# Patient Record
Sex: Female | Born: 1970 | Race: Black or African American | Hispanic: No | Marital: Married | Smoking: Never smoker
Health system: Southern US, Community
[De-identification: ages and names within clinical notes are randomized; demographics above are authoritative.]

## PROBLEM LIST (undated history)

## (undated) HISTORY — PX: ABDOMINAL HYSTERECTOMY: SHX81

---

## 1998-08-23 ENCOUNTER — Inpatient Hospital Stay (HOSPITAL_COMMUNITY): Admission: AD | Admit: 1998-08-23 | Discharge: 1998-08-26 | Payer: Self-pay | Admitting: Obstetrics and Gynecology

## 1998-10-28 ENCOUNTER — Ambulatory Visit (HOSPITAL_COMMUNITY): Admission: RE | Admit: 1998-10-28 | Discharge: 1998-10-28 | Payer: Self-pay | Admitting: Obstetrics and Gynecology

## 2000-12-17 ENCOUNTER — Other Ambulatory Visit: Admission: RE | Admit: 2000-12-17 | Discharge: 2000-12-17 | Payer: Self-pay | Admitting: Obstetrics and Gynecology

## 2002-10-07 ENCOUNTER — Encounter: Payer: Self-pay | Admitting: Obstetrics and Gynecology

## 2002-10-07 ENCOUNTER — Ambulatory Visit (HOSPITAL_COMMUNITY): Admission: RE | Admit: 2002-10-07 | Discharge: 2002-10-07 | Payer: Self-pay | Admitting: Obstetrics and Gynecology

## 2002-10-09 ENCOUNTER — Encounter (INDEPENDENT_AMBULATORY_CARE_PROVIDER_SITE_OTHER): Payer: Self-pay | Admitting: *Deleted

## 2002-10-09 ENCOUNTER — Ambulatory Visit (HOSPITAL_COMMUNITY): Admission: RE | Admit: 2002-10-09 | Discharge: 2002-10-09 | Payer: Self-pay | Admitting: Obstetrics and Gynecology

## 2004-10-31 ENCOUNTER — Encounter: Admission: RE | Admit: 2004-10-31 | Discharge: 2004-10-31 | Payer: Self-pay | Admitting: Obstetrics and Gynecology

## 2004-10-31 ENCOUNTER — Other Ambulatory Visit: Admission: RE | Admit: 2004-10-31 | Discharge: 2004-10-31 | Payer: Self-pay | Admitting: Obstetrics and Gynecology

## 2006-09-25 ENCOUNTER — Ambulatory Visit (HOSPITAL_COMMUNITY): Admission: RE | Admit: 2006-09-25 | Discharge: 2006-09-26 | Payer: Self-pay | Admitting: Obstetrics and Gynecology

## 2007-01-28 ENCOUNTER — Encounter: Admission: RE | Admit: 2007-01-28 | Discharge: 2007-01-28 | Payer: Self-pay | Admitting: Obstetrics and Gynecology

## 2007-11-05 ENCOUNTER — Encounter: Admission: RE | Admit: 2007-11-05 | Discharge: 2007-11-05 | Payer: Self-pay | Admitting: Family Medicine

## 2008-06-13 ENCOUNTER — Emergency Department (HOSPITAL_COMMUNITY): Admission: EM | Admit: 2008-06-13 | Discharge: 2008-06-13 | Payer: Self-pay | Admitting: Emergency Medicine

## 2008-11-05 ENCOUNTER — Encounter: Admission: RE | Admit: 2008-11-05 | Discharge: 2008-11-05 | Payer: Self-pay | Admitting: Obstetrics and Gynecology

## 2008-11-15 ENCOUNTER — Encounter: Admission: RE | Admit: 2008-11-15 | Discharge: 2008-12-22 | Payer: Self-pay | Admitting: Occupational Medicine

## 2010-02-01 ENCOUNTER — Encounter: Admission: RE | Admit: 2010-02-01 | Discharge: 2010-05-02 | Payer: Self-pay | Admitting: Orthopedic Surgery

## 2010-05-04 ENCOUNTER — Encounter: Admission: RE | Admit: 2010-05-04 | Discharge: 2010-06-15 | Payer: Self-pay | Admitting: Orthopedic Surgery

## 2010-10-28 ENCOUNTER — Encounter: Payer: Self-pay | Admitting: Obstetrics and Gynecology

## 2011-02-23 NOTE — Op Note (Signed)
[name], [name]               ACCOUNT NO.:  [id number]   MEDICAL RECORD NO.:  [id number]          PATIENT TYPE:  AMB   LOCATION:  SDC                           FACILITY:  [facility]   PHYSICIAN:  Guy Sandifer. Henderson Cloud, M.D. DATE OF BIRTH:  1971/05/16   DATE OF PROCEDURE:  09/25/2006  DATE OF DISCHARGE:                               OPERATIVE REPORT   PREOPERATIVE DIAGNOSIS:  Menometrorrhagia.   POSTOPERATIVE DIAGNOSIS:  Menometrorrhagia.   PROCEDURE:  Laparoscopically-assisted vaginal hysterectomy.   SURGEON:  Harold Hedge, M.D.   ASSISTANT:  Zelphia Cairo, M.D.   ANESTHESIA:  General endotracheal intubation.   ESTIMATED BLOOD LOSS:  300 mL.   SPECIMENS:  Uterus to pathology.   INDICATIONS AND CONSENT:  This patient is a 39 year old married black  female gravida 3, para 2, status post tubal ligation, status post RSO  who has heavy irregular menses.  Details dictated history and physical.  Laparoscopically-assisted vaginal hysterectomy and removal of her  remaining left ovary only if distinctly abnormal is discussed.  Potential risks and complications have been discussed preoperatively  including but not limited to infection, bowel, bladder, ureteral damage,  bleeding requiring transfusion of blood products with possible  transfusion reaction, HIV and hepatitis acquisition, DVT, PE, pneumonia,  fistula formation, postoperative dyspareunia, pelvic pain and  laparotomy.  All questions were answered and consent signed on the  chart.   FINDINGS:  Upper abdomen is normal.  Uterus is 6 weeks in size and  smooth in contour.  Anterior posterior cul-de-sacs were normal.  Right  tube and ovary were gone.  Left ovary contains a 1-cm smooth translucent  cyst.   PROCEDURE:  The patient is taken to operating room. She is identified,  placed dorsosupine position and general anesthesia is induced via  endotracheal intubation.  She is then placed in dorsal lithotomy  position where she is  prepped abdominally and vaginally. Bladder  straight catheterized.  Hulka tenaculum was placed in the uterus as  manipulated. She is draped in sterile fashion.  The infraumbilical and  suprapubic areas were infiltrated midline with 0.5% plain Marcaine.  A  small infraumbilical incision is made.  A disposable Veress needle was  placed on first attempt without difficulty.  Syringe and drop test are  normal.  2 liters of gas were insufflated under low pressure with good  tympany in the right upper quadrant.  Veress needle was removed and a  10/11 bladeless XL disposable trocar sleeve was placed using direct  visualization with the diagnostic laparoscope.  After placement the  operative laparoscope was placed.  Small suprapubic incision was made  and a 5-mm XL bladeless disposable trocar sleeve was placed under direct  visualization without difficulty.  The above findings noted.  Then using  the gyrus bipolar cautery cutting instrument.  The proximal ligaments  were taken down bilaterally to the level of the vesicouterine  peritoneum.  Vesicouterine peritoneum was taken down cephalad laterally.  Good hemostasis was noted.  Suprapubic trocar sleeve is removed.  All  instruments are removed.  Attention is turned to the vagina.  Posterior  cul-de-sac  is entered sharply.  Cervix was circumscribed with cautery.  Mucosa was entered sharply and bluntly.  Then using the gyrus bipolar  cautery instrument the uterosacral ligaments followed by the bladder  pillars, cardinal ligaments, uterine vessels were taken bilaterally.  Specimens delivered posteriorly.  A branch of the uterine artery is  noted be bleeding on the right side.  This is well visualized and  controlled with bipolar cautery.  It is then grasped with a right angle  and a free tie of 0 Monocryl was also used to ligate the vessel which  achieves good hemostasis.  All suture will be 0 Monocryl was otherwise  designated.  Uterosacral ligaments  are plicated vaginal cuff bilaterally  with separate sutures.  Then plicated midline with a third suture.  Cuff  was closed with figure-of-eights.  Foley catheter is placed in the  bladder and clear urine is noted.  Attention was returned to the  abdomen.  Pneumoperitoneum is reintroduced. Suprapubic trocar 5-mm  bladeless trocar sleeve is reintroduced under direct visualization.  Irrigation careful inspection is carried out.  A bleeder on the  peritoneal edge is controlled with bipolar cautery.  Repeated inspection  under reduced pneumoperitoneum reveals good hemostasis.  Excess fluid is  removed.  All instruments are removed.  The incisions were closed with  Dermabond.  All counts correct.  The patient is awakened, taken to  recovery room in stable condition.      Guy Sandifer Henderson Cloud, M.D.  Electronically Signed     JET/MEDQ  D:  09/25/2006  T:  09/25/2006  Job:  [id number]

## 2011-02-23 NOTE — H&P (Signed)
NAMEJANYE, [name]               ACCOUNT NO.:  [id number]   MEDICAL RECORD NO.:  [id number]          PATIENT TYPE:  AMB   LOCATION:  SDC                           FACILITY:  [facility]   PHYSICIAN:  Guy Sandifer. Henderson Cloud, M.D. DATE OF BIRTH:  19-Feb-1971   DATE OF ADMISSION:  09/25/2006  DATE OF DISCHARGE:                              HISTORY & PHYSICAL   CHIEF COMPLAINT:  Heavy, irregular menses.   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  She had a  Jearld Adjutant IUD in place for approximately 1 year which did not control her  bleeding.  This was subsequently removed.  Ultrasound on Feb 05, 2006,  revealed the uterus measuring 11.4 x 5.3 x 6 cm.  Sonohysterogram was  consistent with a 1.1 cm endometrial polypoid mass.  Left ovary had a  simple cyst.  Repeat ultrasound on September 10, 2006, again reveals 1.2  cm follicular cyst on the left ovary.  There is a 7 mm density in the  endometrial stripe.  After a consideration of the options, she is being  admitted for laparoscopically-assisted vaginal hysterectomy.  The left  ovary will be taken out only if distinctly abnormal.  Potential risks  and complications have been discussed preoperatively.   PAST MEDICAL HISTORY:  Negative.   PAST SURGICAL HISTORY:  1. Laparoscopy with tubal ligation, 2000.  2. Laparoscopy with right salpingo-oophorectomy, 2004.   OBSTETRIC HISTORY:  1. Vaginal delivery x2.  2. Pregnancy termination x1.   FAMILY HISTORY:  Positive for heart murmur in sister, chronic  hypertension in father, sickle cell trait in sister, diet-controlled  diabetes, maternal aunt.  Insulin-dependent diabetes maternal uncle.  Migraine headache in sister, stomach cancer in maternal grandfather, and  cervical cancer in maternal grandmother.   MEDICATIONS:  Vitamins.   ALLERGIES:  No  known drug allergies.   SOCIAL HISTORY:  Denies tobacco, alcohol, or drug abuse.   REVIEW OF SYMPTOMS:  NEUROLOGIC:  Denies headache.  CARDIAC:  Denies  chest pain.  PULMONARY:  Denies shortness of breath.  GI:  Denies recent  changes in bowel habits.   PHYSICAL EXAMINATION:  VITAL SIGNS:  Height 5 feet 4 inches.  HEENT:  Without thyromegaly.  LUNGS:  Clear to auscultation.  HEART:  Regular rate and rhythm.  BACK:  Without CVA tenderness.  BREASTS:  Not examined.  ABDOMEN:  Soft, nontender, without masses.  PELVIC EXAM:  Vulva, vagina, cervix without lesion.  Uterus upper normal  size, mobile, nontender.  Adnexa nontender without masses.  EXTREMITIES:  Grossly within normal limits.  NEUROLOGIC:  Grossly within normal limits.   ASSESSMENT:  Menometrorrhagia.   PLAN:  Laparoscopically-assisted vaginal hysterectomy.      Guy Sandifer Henderson Cloud, M.D.  Electronically Signed     JET/MEDQ  D:  09/10/2006  T:  09/10/2006  Job:  [id number]

## 2011-02-23 NOTE — Discharge Summary (Signed)
[name], [name]               ACCOUNT NO.:  [id number]   MEDICAL RECORD NO.:  [id number]          PATIENT TYPE:  OIB   LOCATION:  9308                          FACILITY:  [facility]   PHYSICIAN:  Guy Sandifer. Henderson Cloud, M.D. DATE OF BIRTH:  13-Nov-1970   DATE OF ADMISSION:  09/25/2006  DATE OF DISCHARGE:  09/26/2006                               DISCHARGE SUMMARY   ADMITTING DIAGNOSIS:  Menometrorrhagia.   DISCHARGE DIAGNOSIS:  Menometrorrhagia.   PROCEDURE:  On September 25, 2006, laparoscopically-assisted vaginal  hysterectomy.   REASON FOR ADMISSION:  This patient is a 40 year old married black  female G3, P2 status post tubal ligation and status post RSO who has  increasingly heavy irregular menses.  Details are dictated in the  history and physical.  She is admitted for surgical management.   HOSPITAL COURSE:  The patient undergoes the above procedure.  On the  evening of surgery, Foley catheter is out.  She is tolerating a regular  diet, has not yet passed flatus and has good pain relief.  Vital signs  are stable.  She is afebrile.  On the day of discharge, she is passing  flatus, tolerating regular diet, feeling good and getting good pain  relief with oral pain medication.  Vital signs are stable.  She is  afebrile.  Hemoglobin 11.3, white count 11.0, platelet count 264,000 and  pathology is pending.   CONDITION ON DISCHARGE:  Good.   DIET:  Regular as tolerated.   ACTIVITY:  No heavy lifting, no operation of automobiles, no vaginal  entry.   She is to call the office for problems including not limited to heavy  vaginal bleeding, persistent nausea, vomiting, increasing pain or  temperature of 101 degrees.   MEDICATIONS:  1. Percocet 5/325 mg #40 one to two p.o. q.6 h. p.r.n.  2. Ibuprofen q.6 h. p.r.n.  3. Multivitamin daily.   FOLLOW-UP:  Is in the office in 2 weeks.      Guy Sandifer Henderson Cloud, M.D.  Electronically Signed    JET/MEDQ  D:  09/26/2006  T:  09/26/2006   Job:  [id number]

## 2011-02-27 NOTE — Op Note (Signed)
[name], [name]                           ACCOUNT NO.:  [id number]   MEDICAL RECORD NO.:  [id number]                   PATIENT TYPE:  AMB   LOCATION:  SDC                                  [facility]ACILITY:  [facility]   PHYSICIAN:  Guy Sandifer. Arleta Creek, M.D.           DATE OF BIRTH:  [birth year]   DATE OF PROCEDURE:  10/09/2002  DATE OF DISCHARGE:                                 OPERATIVE REPORT   PREOPERATIVE DIAGNOSES:  1. Pelvic pain.  2. Right ovarian cyst.   POSTOPERATIVE DIAGNOSES:  1. Pelvic pain.  2. Right ovarian cyst.   PROCEDURE:  Laparoscopy with right salpingo-oophorectomy.   SURGEON:  Guy Sandifer. Henderson Cloud, M.D.   ANESTHESIA:  Burnett Corrente, M.D.   ESTIMATED BLOOD LOSS:  30 cc.   SPECIMENS:  Right fallopian tube and ovary.   INDICATIONS AND CONSENT:  This patient is a 40 year old married black female  G3, P2 status post tubal ligation with right lower quadrant pain and a small  cyst suspicious for dermoid.  Details are dictated in the history and  physical.  Laparoscopy with possible right ovarian cystectomy, possible  right salpingo-oophorectomy has been discussed with the patient.  Potential  risks and complications have been discussed preoperatively including, but  not limited to, infection, bowel, bladder, ureteral damage, bleeding  requiring transfusion of blood products with possible transfusion reaction,  HIV and hepatitis acquisition, DVT, PE, pneumonia, recurrent pelvic pain.  All questions have been answered and consent is signed on the chart.  After  talking to the patient in the preoperative holding area, her right lower  abdomen is marked with a marking pencil in anticipation of a possible right  salpingo-oophorectomy.   FINDINGS:  Upper abdomen is grossly normal.  Uterus is normal in size and  contour.  Anterior/posterior cul-de-sacs are normal.  Fallopian tubes are  status post Filshie clip application bilaterally.  Left ovary looks normal.  Right ovary  is minimally enlarged with no obvious cyst on the capsule.  The  Filshie clip is adherent to the proximal portion of the right fallopian  tube.  The Filshie clip from the left fallopian tube has become completely  dislodged from that side and is now loosely adherent to the right pelvic  side wall beneath the right ovary.  There is a second Filshie clip that is  also adherent there.   PROCEDURE:  The patient was taken to the operating room, placed in dorsal  supine position where general anesthesia is induced via endotracheal  intubation.  She is then placed in the dorsal lithotomy position where she  is prepped abdominally and vaginally, bladder straight catheterized.  Hulka  tenaculum was placed in the uterus as a manipulator and she is draped in a  sterile fashion.  A small infraumbilical incision is made and the 10-11  disposable trocar sleeve was placed on the first attempt without difficulty.  Placement is verified  with laparoscope and no damage to surrounding  structures is noted.  Pneumoperitoneum is induced.  A small suprapubic  incision is made and the 5 mm nondisposable trocar sleeve was placed under  direct visualization without difficulty.  The above findings are noted.  The  course of the right ureter is identified, seemed to be clear of the area of  surgery.  The Filshie clips adherent to the right pelvic side wall are only  loosely adhered and pulled free quite easily and are removed from the  abdominal cavity.  There is a single filmy adhesion from the left ovary to  the left pelvic side wall which is taken down bluntly.  Then using the gyrus  cautery cutting instrument the right infundibulopelvic ligament is taken  down and the remainder of the tube and ovary are freed in a stepwise  fashion.  Complete hemostasis is obtained.  Careful inspection reveals the  ureter to be peristalsing normally and clear of the area of surgery.  Then,  using the 5 mm laparoscope through the  anterior trocar sleeve, the Endo  catch is used to remove the tube and ovary from the umbilical incision  without difficulty.  Reinspection through the operative laparoscope under  reduced pneumoperitoneum reveals continued good hemostasis from the area of  surgery.  Excess fluid is removed.  Suprapubic trocar sleeve is removed and  good hemostasis is noted there as well.  Pneumoperitoneum is completely  reduced and the umbilical trocar sleeve is removed.  The fascia and the  umbilical incision is closed with two 0 Vicryl sutures with great care being  taken not to pick up any underlying structures.  The incisions were then  injected with 0.5% plain Marcaine and the skin is closed with Dermabond.  Hulka tenaculum is removed and good hemostasis is noted.  All counts are  correct.  The patient is awakened, taken to recovery room in stable  condition.                                               Guy Sandifer Arleta Creek, M.D.    JET/MEDQ  D:  10/09/2002  T:  10/09/2002  Job:  [id number]

## 2011-02-27 NOTE — H&P (Signed)
[name], [name]                           ACCOUNT NO.:  [id number]   MEDICAL RECORD NO.:  [id number]                   PATIENT TYPE:  OUT   LOCATION:  ULT                                  [facility]ACILITY:  [facility]   PHYSICIAN:  Guy Sandifer. Arleta Creek, M.D.           DATE OF BIRTH:  02/05/1971   DATE OF ADMISSION:  10/07/2002  DATE OF DISCHARGE:                                HISTORY & PHYSICAL   CHIEF COMPLAINT:  Ovarian cyst and pelvic pain.   HISTORY OF PRESENT ILLNESS:  This patient is a 40 year old, married, black  female, G3, P2, status post tubal ligation, with right lower quadrant pain  that is random.  However, at times it can be quite severe.  An ultrasound on  August 03, 2002, revealed a 2.1 cm simple cyst at the right ovary, as well  as a 15 mm probable dermoid cyst.  The left ovary was essentially normal.  A  repeat ultrasound on October 07, 2002, revealed an 11 mm probable dermoid  of the right ovary.  Options of management have been discussed with the  patient.  Laparoscopy with possible right ovarian cystectomy and possible  right salpingo-oophorectomy have been discussed preoperatively.  The  potential risks and complications have been reviewed.   PAST MEDICAL HISTORY:  Negative.   PAST SURGICAL HISTORY:  Laparoscopic tubal ligation in 2000.   OBSTETRIC HISTORY:  Vaginal delivery x 2.  Therapeutic abortion x 1.   FAMILY HISTORY:  Heart murmur in sister.  Chronic hypertension in father.  Sickle trait in sister.  Diet-controlled diabetes in paternal aunt.  Insulin-  dependent diabetes in maternal uncle.  Migraine headaches in a sister.  Stomach cancer in maternal grandfather.  Cervical cancer in maternal  grandmother.   MEDICATIONS:  Multivitamins.   ALLERGIES:  No known drug allergies.   SOCIAL HISTORY:  The patient denies tobacco, alcohol, or drug abuse.   REVIEW OF SYSTEMS:  Negative, except as above.   PHYSICAL EXAMINATION:  HEIGHT:  5 feet 4 inches.  WEIGHT:   151 pounds.  VITAL SIGNS:  Blood pressure 124/80.  HEENT:  Without thyromegaly.  LUNGS:  Clear to auscultation.  HEART:  Regular rate and rhythm.  BACK:  Without CVA tenderness.  BREASTS:  Without mass, retraction, or discharge.  ABDOMEN:  Soft and nontender without masses.  PELVIC:  Vulva, vagina, and cervix without lesion.  The uterus was  anteverted, irregular in contour, and about 6 weeks in size.  Left adnexa  nontender without masses.  Right adnexa moderately tender without palpable  masses.  EXTREMITIES:  Grossly within normal limits.  NEUROLOGIC:  Grossly within normal limits.   ASSESSMENT:  1. Pelvic pain.  2. Right ovarian cyst.  Guy Sandifer Arleta Creek, M.D.    JET/MEDQ  D:  10/07/2002  T:  10/07/2002  Job:  [id number]

## 2011-02-27 NOTE — H&P (Signed)
   [name], [name]                           ACCOUNT NO.:  [id number]   MEDICAL RECORD NO.:  [id number]                   PATIENT TYPE:  AMB   LOCATION:  SDC                                  [facility]ILITY[facility]   PHYSICIAN:  Guy Sandifer. Arleta Creek, M.D.           DATE OF BIRTH:  1970/11/30   DATE OF ADMISSION:  10/09/2002  DATE OF DISCHARGE:                                HISTORY & PHYSICAL   CHIEF COMPLAINT:  Pelvic pain.   HISTORY OF PRESENT ILLNESS:  This patient is a 40 year old married black  female G3 P2 status post tubal ligation with right lower quadrant pain and  now premenstrual pain and dysmenorrhea.  Ultrasound on August 03, 2002  revealed a uterus measuring 12.2 x 6.1 x 5.4 cm and a 2.1 cm simple cyst, as  well as a 15 mm cyst on the right ovary.  After discussing options and  management she is being admitted for laparoscopy with possible right  salpingo-oophorectomy.   PAST MEDICAL HISTORY:  Negative.   PAST SURGICAL HISTORY:  Laparoscopy with tubal ligation in 2000.   OBSTETRICAL HISTORY:  Vaginal delivery x2.  Therapeutic abortion x1.   FAMILY HISTORY:  Positive for heart murmur in sister, chronic hypertension  in father, sickle cell trait in sister, diet-controlled diabetes in maternal  aunt, insulin-dependent diabetes in maternal uncle, migraine headaches in  sister, stomach cancer in maternal grandfather, cervical cancer in maternal  grandmother.   MEDICATIONS:  Vitamins.   ALLERGIES:  No known drug allergies.   SOCIAL HISTORY:  The patient denies tobacco, alcohol, or drug abuse.   REVIEW OF SYSTEMS:  Negative except as above.   PHYSICAL EXAMINATION:  VITAL SIGNS:  Height 5 feet 4 inches.  Blood pressure  124/80.  HEENT:  Without thyromegaly.  LUNGS:  Clear to auscultation.  HEART:  Regular rate and rhythm.  BACK:  Without CVA tenderness.  BREASTS:  Not examined.  ABDOMEN:  Soft, nontender, without masses.  PELVIC:  Vulva, vagina, cervix without  lesions.  Uterus is anteverted, about  six weeks in size, somewhat irregular in contour.  Right adnexa is tender  without palpable masses.  Left adnexa nontender without masses.  EXTREMITIES AND NEUROLOGIC:  Grossly within normal limits.    ASSESSMENT:  Dysmenorrhea and pelvic pain.   PLAN:  Laparoscopy with possible right salpingo-oophorectomy.                                               Guy Sandifer Arleta Creek, M.D.    JET/MEDQ  D:  10/06/2002  T:  10/06/2002  Job:  [id number]

## 2011-06-10 ENCOUNTER — Emergency Department (HOSPITAL_BASED_OUTPATIENT_CLINIC_OR_DEPARTMENT_OTHER)
Admission: EM | Admit: 2011-06-10 | Discharge: 2011-06-11 | Disposition: A | Discharge status: Home / Self Care | Payer: 59 | Attending: Emergency Medicine | Admitting: Emergency Medicine

## 2011-06-10 ENCOUNTER — Encounter: Payer: Self-pay | Admitting: *Deleted

## 2011-06-10 ENCOUNTER — Emergency Department (INDEPENDENT_AMBULATORY_CARE_PROVIDER_SITE_OTHER): Payer: 59

## 2011-06-10 DIAGNOSIS — X500XXA Overexertion from strenuous movement or load, initial encounter: Secondary | ICD-10-CM | POA: Insufficient documentation

## 2011-06-10 DIAGNOSIS — M25579 Pain in unspecified ankle and joints of unspecified foot: Secondary | ICD-10-CM

## 2011-06-10 DIAGNOSIS — Y92838 Other recreation area as the place of occurrence of the external cause: Secondary | ICD-10-CM | POA: Insufficient documentation

## 2011-06-10 DIAGNOSIS — Y9239 Other specified sports and athletic area as the place of occurrence of the external cause: Secondary | ICD-10-CM | POA: Insufficient documentation

## 2011-06-10 DIAGNOSIS — S82899A Other fracture of unspecified lower leg, initial encounter for closed fracture: Secondary | ICD-10-CM

## 2011-06-10 DIAGNOSIS — W219XXA Striking against or struck by unspecified sports equipment, initial encounter: Secondary | ICD-10-CM

## 2011-06-10 DIAGNOSIS — Y9364 Activity, baseball: Secondary | ICD-10-CM | POA: Insufficient documentation

## 2011-06-10 DIAGNOSIS — S82401A Unspecified fracture of shaft of right fibula, initial encounter for closed fracture: Secondary | ICD-10-CM

## 2011-06-10 MED ORDER — HYDROCODONE-ACETAMINOPHEN 5-325 MG PO TABS: 2.0000 | ORAL_TABLET | ORAL | Status: AC | PRN

## 2011-06-10 MED ORDER — IBUPROFEN 600 MG PO TABS: 600.0000 mg | ORAL_TABLET | Freq: Three times a day (TID) | ORAL | Status: AC | PRN

## 2011-06-10 MED ORDER — IBUPROFEN 400 MG PO TABS
400.0000 mg | ORAL_TABLET | Freq: Once | ORAL | Status: AC
  Administered 2011-06-10: 400 mg via ORAL
  Filled 2011-06-10: qty 1

## 2011-06-10 NOTE — ED Provider Notes (Signed)
Scribed for Dr. Fredricka Bonine, the patient was seen in room 08. This chart was scribed by Hillery Hunter. This patient's care was started at 23:05.   History   CSN: [id number] Arrival date & time: 06/10/2011 10:36 PM  Chief Complaint  Patient presents with  . Ankle Pain   The history is provided by the patient.   [name] is a 40 y.o. female who presents to the Emergency Department complaining of right ankle pain. She reports that she was playing a family game of softball prior to arrival and when attempting to slide into a base she inverted her right ankle, "felt a pop" sensation, and now has pain worse with bearing weight. She took Ibuprofen 400mg  a few hours ago with some improvement in pain.  History reviewed. No pertinent past medical history.  Past Surgical History  Procedure Date  . Abdominal hysterectomy     History reviewed. No pertinent family history.  History  Substance Use Topics  . Smoking status: Never Smoker   . Smokeless tobacco: Not on file  . Alcohol Use: Yes     Review of Systems  Respiratory: Negative for shortness of breath.   Cardiovascular: Negative for chest pain.  Gastrointestinal: Negative for abdominal pain.  Musculoskeletal:       Pain with weight bearing  Skin: Negative for wound.  Neurological: Negative for weakness, light-headedness and numbness.  Psychiatric/Behavioral: Negative for confusion.  All other systems reviewed and are negative.    Physical Exam  BP 131/116  Pulse 80  Temp(Src) 98.6 F (37 C) (Oral)  Resp 20  Ht 5\' 4"  (1.626 m)  Wt 160 lb (72.576 kg)  BMI 27.46 kg/m2  SpO2 98%  Physical Exam  Nursing note and vitals reviewed. Constitutional: She appears well-developed and well-nourished. No distress.  HENT:  Head: Normocephalic and atraumatic.  Pulmonary/Chest: Effort normal and breath sounds normal. No respiratory distress. She has no wheezes. She has no rales. She exhibits no tenderness.    Musculoskeletal:       Swelling about medial malleolus, and just superior to the lateral malleolus  Neurological:       Plantarflexion and dorsiflexion intact , inversion and eversion of the foot is intact, DP / PT pulses strong and symmetrical, no tenderness at proximal tibia, no tenderness about the knee, no ligamentous laxity about the right knee    ED Course  Procedures  OTHER DATA REVIEWED: Nursing notes, vital signs, and past medical records reviewed.  DIAGNOSTIC STUDIES: Oxygen Saturation is 98% on room air, normal by my interpretation.    LABS / RADIOLOGY:   XR Right ankle / foot - 3 views Interpreted by me contemporaneously. Distal fibula fracture with minimal displacement, normal joint spaces, soft tissue swelling  PROCEDURES: Right posterior short leg splint applied by ER tech Neurovascular intact, brisk capillary refill post splint application  ED COURSE / COORDINATION OF CARE: 23:29. Ordered Ibuprofen 400mg  PO and right posterior short leg splint and crutches. 23:31. Discussed test findings and and plan of care with patient at bedside.   MDM: ankle sprain, ankle fracture   IMPRESSION: Diagnoses that have been ruled out:  Diagnoses that are still under consideration:  Final diagnoses:  Closed right fibular fracture    PLAN:  Discharge home I have reviewed the discharge instructions with the patient  CONDITION ON DISCHARGE: Stable  MEDICATIONS GIVEN IN THE E.D.  Medications  ibuprofen (ADVIL,MOTRIN) tablet 400 mg     DISCHARGE MEDICATIONS: New Prescriptions   HYDROCODONE-ACETAMINOPHEN (NORCO)  5-325 MG PER TABLET    Take 2 tablets by mouth every 4 (four) hours as needed for pain.   IBUPROFEN (ADVIL,MOTRIN) 600 MG TABLET    Take 1 tablet (600 mg total) by mouth every 8 (eight) hours as needed for pain.    SCRIBE ATTESTATION: I personally performed the services described in this documentation, which was scribed in my presence. The recorded  information has been reviewed and considered.   Dg Ankle Complete Right  06/10/2011  *RADIOLOGY REPORT*  Clinical Data: Right ankle pain after injury playing kick ball.  RIGHT ANKLE - COMPLETE 3+ VIEW  Comparison: None.  Findings: Acute oblique fracture of the distal right fibula with minimal lateral displacement.  Widening of the medial aspect of the tibiotalar joint suggesting medial ligamentous injury.  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  IMPRESSION: Mildly displaced fracture of the distal right fibula with widening of the medial ankle joint suggesting ligamentous injury.  Original Report Authenticated By: Marlon Pel, M.D.   The patient will be placed in a posterior normal right lower leg splint, and is instructed to remain nonweightbearing using crutches until orthopedic followup this coming week with [place] [facility]. I've inform her of the nature of her injury and shown her her x-rays and she states her understanding of and agreement with the diagnosis and plan of care.  Chloe Bonier, MD 06/10/11 [phone]

## 2011-06-10 NOTE — ED Notes (Signed)
Pt injured her right ankle playing kickball earlier today

## 2011-06-14 ENCOUNTER — Ambulatory Visit (HOSPITAL_COMMUNITY): Payer: 59

## 2011-06-14 ENCOUNTER — Ambulatory Visit (HOSPITAL_COMMUNITY)
Admission: RE | Admit: 2011-06-14 | Discharge: 2011-06-14 | Disposition: A | Discharge status: Home / Self Care | Payer: 59 | Source: Ambulatory Visit | Attending: Orthopedic Surgery | Admitting: Orthopedic Surgery

## 2011-06-14 DIAGNOSIS — S82899A Other fracture of unspecified lower leg, initial encounter for closed fracture: Secondary | ICD-10-CM | POA: Insufficient documentation

## 2011-06-14 DIAGNOSIS — Y998 Other external cause status: Secondary | ICD-10-CM | POA: Insufficient documentation

## 2011-06-14 DIAGNOSIS — Z01812 Encounter for preprocedural laboratory examination: Secondary | ICD-10-CM | POA: Insufficient documentation

## 2011-06-14 DIAGNOSIS — Y9364 Activity, baseball: Secondary | ICD-10-CM | POA: Insufficient documentation

## 2011-06-14 DIAGNOSIS — W010XXA Fall on same level from slipping, tripping and stumbling without subsequent striking against object, initial encounter: Secondary | ICD-10-CM | POA: Insufficient documentation

## 2011-06-14 LAB — COMPREHENSIVE METABOLIC PANEL
ALT: 11 U/L (ref 0–35)
AST: 15 U/L (ref 0–37)
Albumin: 3.5 g/dL (ref 3.5–5.2)
CO2: 29 mEq/L (ref 19–32)
Chloride: 105 mEq/L (ref 96–112)
Creatinine, Ser: 0.81 mg/dL (ref 0.50–1.10)
Sodium: 141 mEq/L (ref 135–145)
Total Bilirubin: 0.3 mg/dL (ref 0.3–1.2)

## 2011-06-14 LAB — PROTIME-INR: INR: 1.01 (ref 0.00–1.49)

## 2011-06-14 LAB — SURGICAL PCR SCREEN: Staphylococcus aureus: POSITIVE — AB

## 2011-06-14 LAB — CBC
HCT: 35.6 % — ABNORMAL LOW (ref 36.0–46.0)
Hemoglobin: 12.2 g/dL (ref 12.0–15.0)
MCV: 78.4 fL (ref 78.0–100.0)
RBC: 4.54 MIL/uL (ref 3.87–5.11)
RDW: 13.5 % (ref 11.5–15.5)
WBC: 5.3 10*3/uL (ref 4.0–10.5)

## 2011-06-14 LAB — APTT: aPTT: 26 seconds (ref 24–37)

## 2011-07-17 NOTE — Op Note (Signed)
[name], [name] NO.:  [id number]  MEDICAL RECORD NO.:  [id number]  LOCATION:  [facility]                         FACILITY:  [facility]  PHYSICIAN:  Vania Rea. Mckinzee Spirito, M.D.  DATE OF BIRTH:  [birth year]  DATE OF PROCEDURE:  06/14/2011 DATE OF DISCHARGE:  06/14/2011                              OPERATIVE REPORT   PREOPERATIVE DIAGNOSIS:  Displaced right distal fibular fracture.  POSTOPERATIVE DIAGNOSIS:  Displaced right distal fibular fracture.  PROCEDURE:  Open reduction and internal fixation of the right distal fibular fracture.  SURGEON:  Vania Rea. Almando Brawley, MD  ASSISTANT:  Lucita Lora. Shuford, PAC  ANESTHESIA:  General as well as a popliteal block.  TOURNIQUET TIME:  Less than 60 minutes.  ESTIMATED BLOOD LOSS:  Minimal.  DRAINS:  None.  HISTORY:  [name] is a 40 year old female who sustained a right ankle fracture with displacement of the distal fibula and widening of the medial clear space.  She was brought to the operating at this time for planned ORIF of the distal fibular fracture.  Preoperatively, we counseled Ms. Duerst on treatment options as well as risks versus benefits thereof.  Possible surgical complications were reviewed including potential for bleeding, infection, neurovascular injury, DVT, PE, malunion, nonunion, loss of fixation, and possible need for additional surgery.  She understands and accepts and agrees with our planned procedure.  PROCEDURE IN DETAIL:  After undergoing routine preop evaluation, the patient received prophylactic antibiotics and a popliteal block was established by the Anesthesia Department in the holding area.  Brought to the operating room, placed supine on the operating table and underwent smooth induction of an LMA general anesthesia.  A tourniquet was applied in the right thigh and right leg was sterilely prepped and draped in standard fashion.  Time-out was called.  Of note, Ralene Bathe, [facility], was the  surgical assistant throughout this case to assist with retraction, positioning of the extremity, and intraoperative decision making, as well as wound management and closure.  The leg was exsanguinated with a tourniquet inflated to 350 mmHg.  Time-out was called.  A direct lateral approach to the distal femur was made through a 7-cm incision over the distal fibula.  Skin flaps were elevated and dissection carried deeply to deep fascia, with care taken to identify and protect the adjacent branches of the sensory branch at the peroneal nerve.  The lateral margin of the distal fibula was exposed with subperiosteal dissection as was the fracture site and abundant interposed soft tissue was then removed and the fracture site was irrigated.  Under direct visualization, we achieved an anatomic reduction of the fibula, temporarily held this with a tenaculum and contoured a 7-hole 1/3rd tubular plate fit over the posterior margin of the fibula.  We began by placing a lag screw through the plate obliquely across the fracture site, obtaining compression and good fixation.  We then placed 2 locking screws distally into the fibula and then a standard lag screw proximally through the plate and 2 additional locking screws proximal.  There was 1 screw hole at the midportion of the plate did not achieve appropriate bony purchase and this was left empty. Fluoroscopic images were then  obtained which showed good alignment of the fracture site, good position of hardware, and overall was much to our satisfaction.  Wound was then irrigated, was closed with 0 Vicryl for the deep fascia, 2-0 Vicryl for subcu, and intracuticular 3-0 Monocryl for skin followed by Steri-Strips.  Dry dressing was applied followed by a well-padded short-leg plaster stirrup splint with ankle in neutral position.  At this point, the tourniquet was then let down.  The patient was awakened, extubated, and taken to the recovery room  in stable condition.     Vania Rea. Orren Pietsch, M.D.     KMS/MEDQ  D:  06/14/2011  T:  06/14/2011  Job:  [id number]  Electronically Signed by Francena Hanly M.D. on 07/17/2011 06:43:28 PM

## 2012-12-10 ENCOUNTER — Ambulatory Visit: Payer: BC Managed Care – PPO

## 2013-05-21 IMAGING — CR DG ANKLE COMPLETE 3+V*R*
3 series · 3 of 3 positions shown · non-contrast
Comparison: None.

CLINICAL DATA: Right ankle pain after injury playing [employer].

RIGHT ANKLE - COMPLETE 3+ VIEW

[t ankle joint ap right]
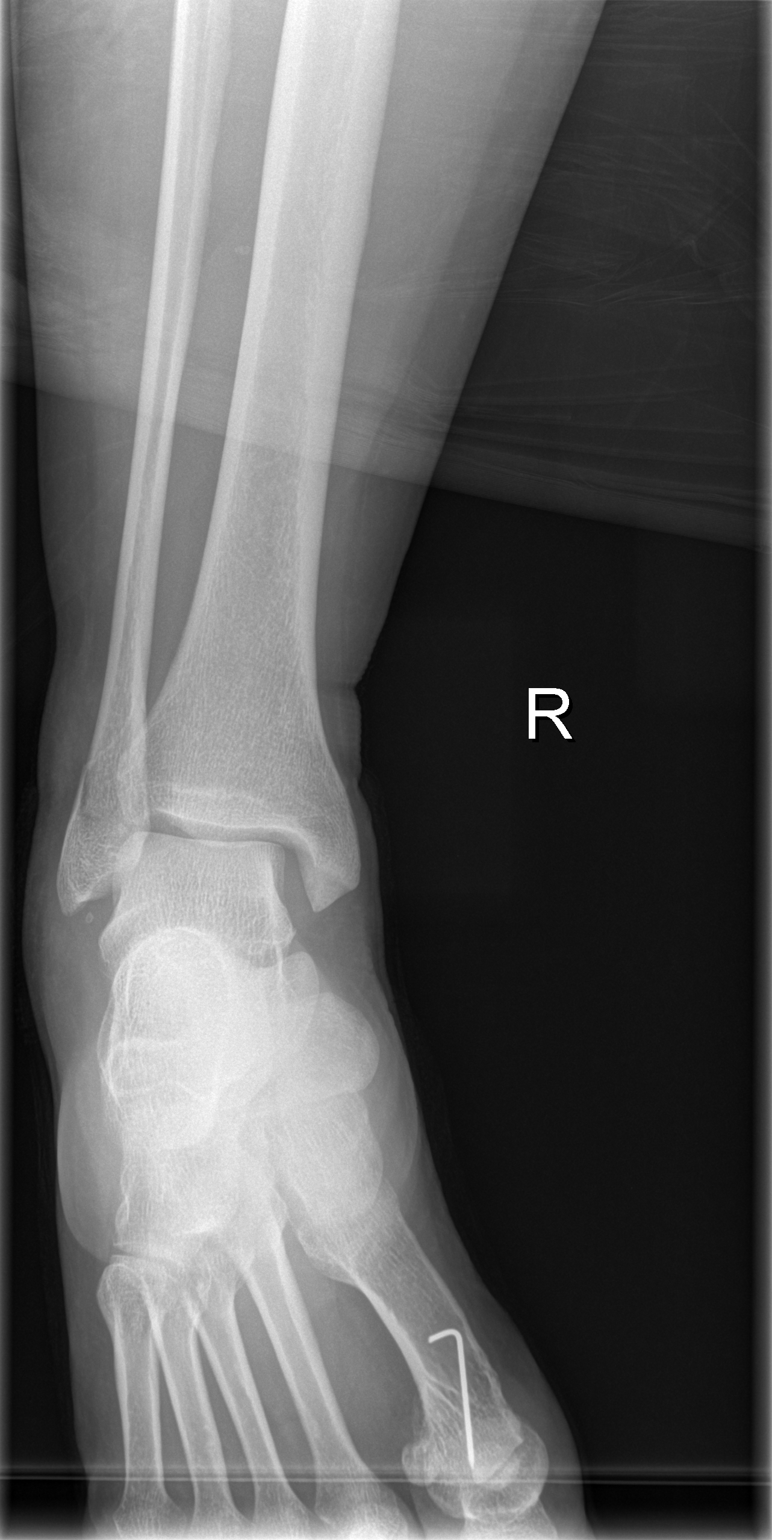

[t ankle joint oblique right]
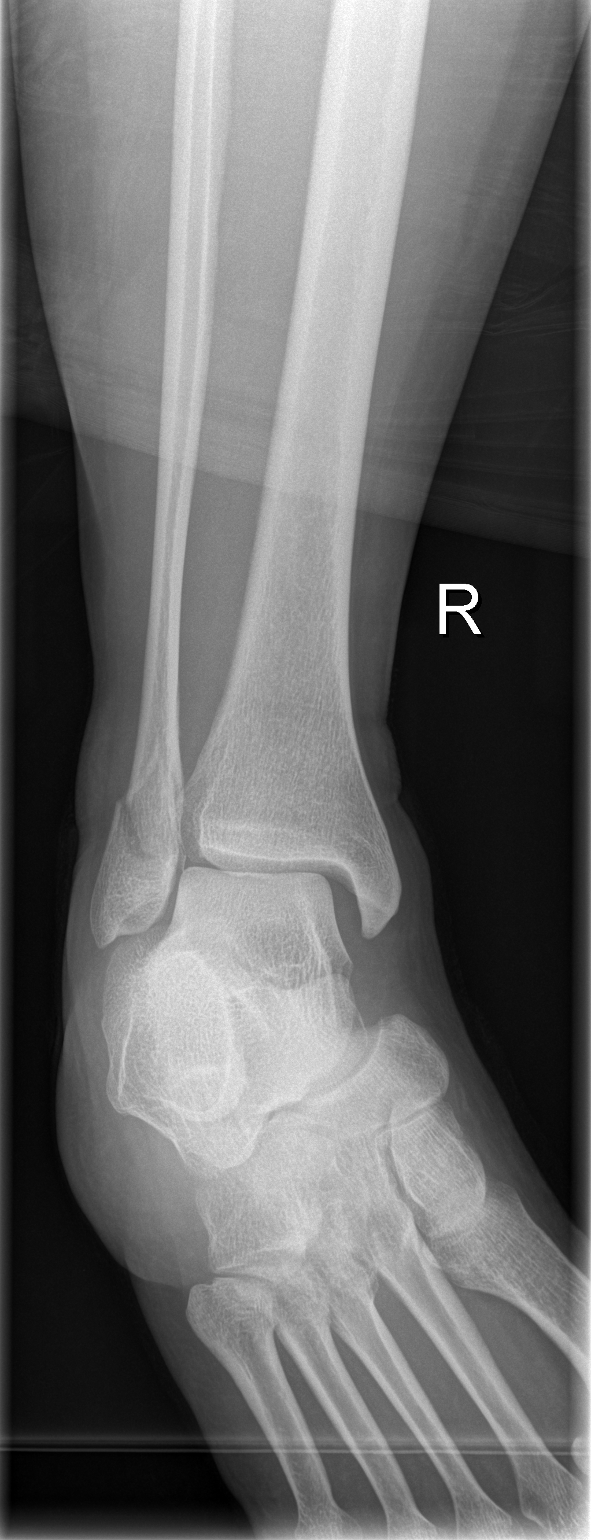

[t ankle joint lat right]
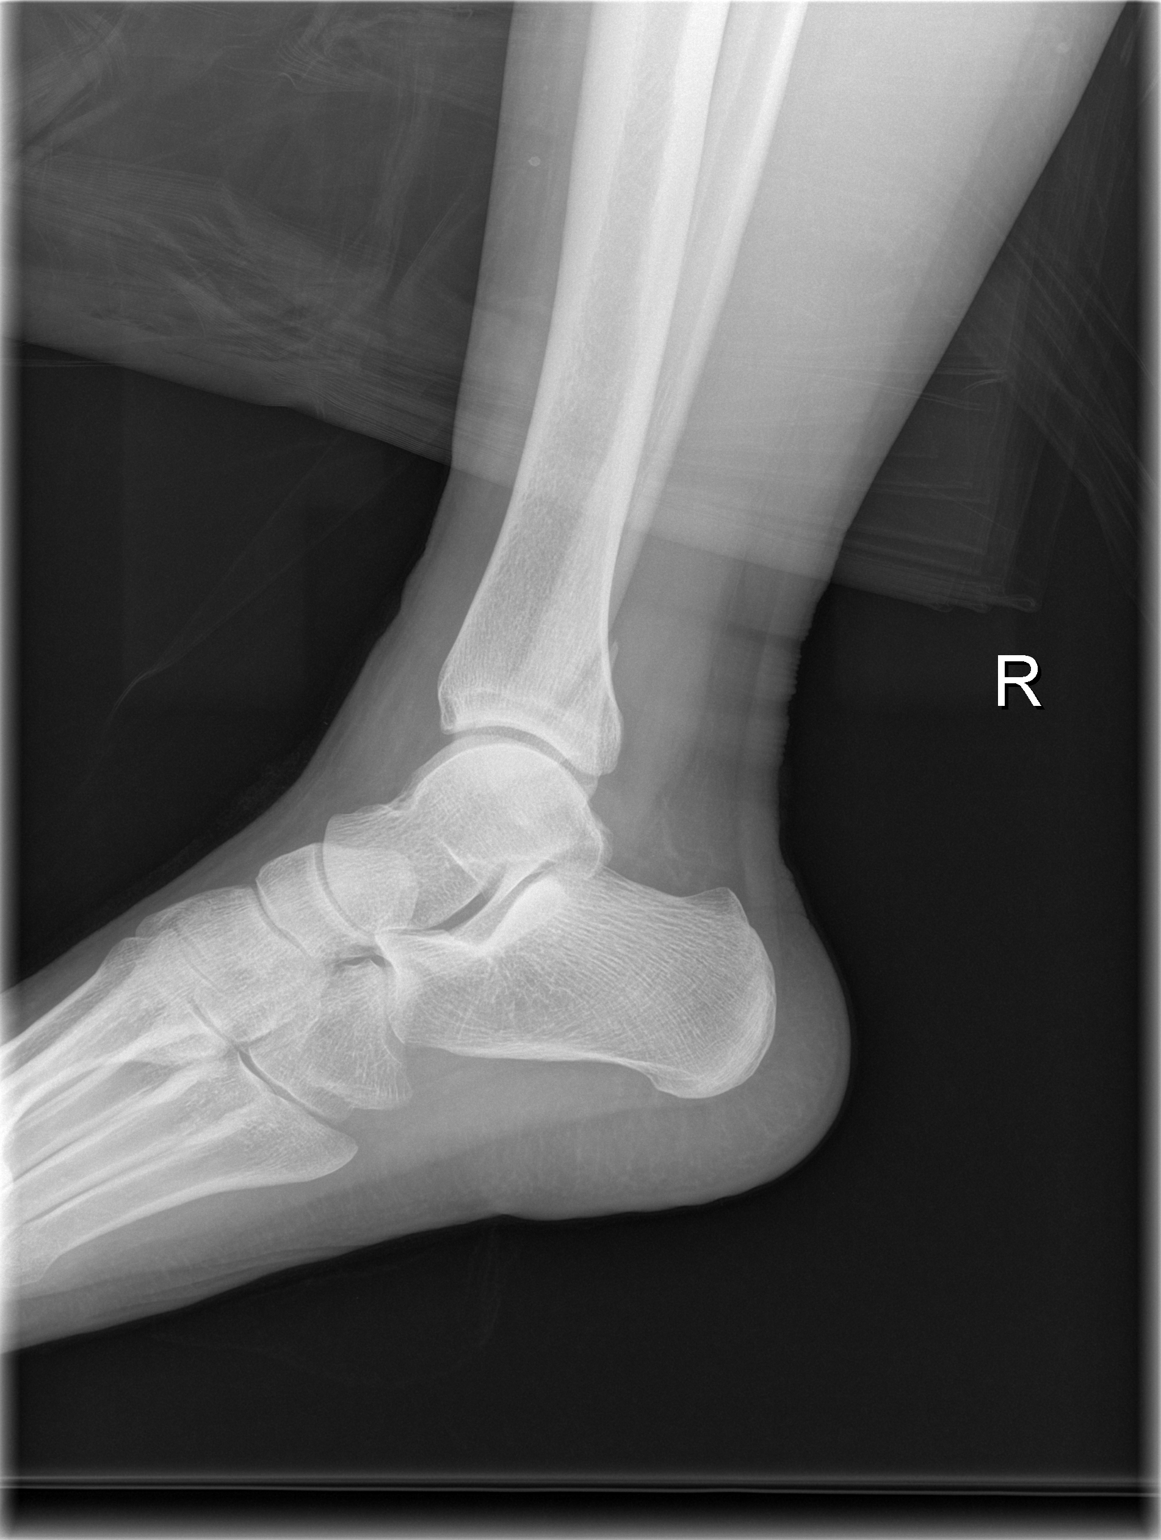

[3 of 3 positions shown; findings below may reference images not displayed]

FINDINGS: Acute oblique fracture of the distal right fibula with
minimal lateral displacement.  Widening of the medial aspect of the
tibiotalar joint suggesting medial ligamentous injury.  Small
ununited ossicle off of the inferior fibula appears to be well
corticated and is probably chronic.
IMPRESSION: Mildly displaced fracture of the distal right fibula with widening
of the medial ankle joint suggesting ligamentous injury.

## 2019-07-23 ENCOUNTER — Other Ambulatory Visit: Payer: Self-pay

## 2019-07-23 ENCOUNTER — Encounter (HOSPITAL_COMMUNITY): Payer: Self-pay

## 2019-07-23 ENCOUNTER — Emergency Department (HOSPITAL_COMMUNITY): Payer: No Typology Code available for payment source

## 2019-07-23 ENCOUNTER — Emergency Department (HOSPITAL_COMMUNITY)
Admission: EM | Admit: 2019-07-23 | Discharge: 2019-07-23 | Disposition: A | Discharge status: Home / Self Care | Payer: No Typology Code available for payment source | Attending: Emergency Medicine | Admitting: Emergency Medicine

## 2019-07-23 DIAGNOSIS — R0789 Other chest pain: Secondary | ICD-10-CM | POA: Diagnosis not present

## 2019-07-23 DIAGNOSIS — Y93I9 Activity, other involving external motion: Secondary | ICD-10-CM | POA: Diagnosis not present

## 2019-07-23 DIAGNOSIS — Z79899 Other long term (current) drug therapy: Secondary | ICD-10-CM | POA: Insufficient documentation

## 2019-07-23 DIAGNOSIS — Y92410 Unspecified street and highway as the place of occurrence of the external cause: Secondary | ICD-10-CM | POA: Insufficient documentation

## 2019-07-23 DIAGNOSIS — Y999 Unspecified external cause status: Secondary | ICD-10-CM | POA: Diagnosis not present

## 2019-07-23 DIAGNOSIS — R109 Unspecified abdominal pain: Secondary | ICD-10-CM | POA: Insufficient documentation

## 2019-07-23 MED ORDER — IBUPROFEN 800 MG PO TABS
800.0000 mg | ORAL_TABLET | Freq: Once | ORAL | Status: AC
  Administered 2019-07-23: 800 mg via ORAL
  Filled 2019-07-23: qty 1

## 2019-07-23 MED ORDER — IBUPROFEN 800 MG PO TABS: 800.0000 mg | ORAL_TABLET | Freq: Three times a day (TID) | ORAL | 0 refills | Status: AC
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
## 2019-07-23 NOTE — ED Notes (Signed)
Pt was verbalized discharge instructions. Pt had no further questions at this time. NAD. 
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
## 2019-07-23 NOTE — Discharge Instructions (Signed)
Follow up with your md if needed °

## 2019-12-05 ENCOUNTER — Ambulatory Visit: Payer: Self-pay | Attending: Internal Medicine

## 2019-12-05 DIAGNOSIS — Z23 Encounter for immunization: Secondary | ICD-10-CM | POA: Insufficient documentation

## 2019-12-05 NOTE — Progress Notes (Signed)
   Covid-19 Vaccination Clinic  Name:  [name]    MRN: [id number] DOB: 02-Mar-1971  12/05/2019  [name] was observed post Covid-19 immunization for 15 minutes without incidence. She was provided with Vaccine Information Sheet and instruction to access the V-Safe system.   [name] was instructed to call 911 with any severe reactions post vaccine: Marland Kitchen Difficulty breathing  . Swelling of your face and throat  . A fast heartbeat  . A bad rash all over your body  . Dizziness and weakness    Immunizations Administered    Name Date Dose VIS Date Route   Pfizer COVID-19 Vaccine 12/05/2019  4:43 PM 0.3 mL 09/18/2019 Intramuscular   Manufacturer: [employer], [employer]   Lot: [id number]   NDC: [address]-8

## 2019-12-26 ENCOUNTER — Ambulatory Visit: Payer: Self-pay | Attending: Internal Medicine

## 2019-12-26 ENCOUNTER — Other Ambulatory Visit: Payer: Self-pay

## 2019-12-26 DIAGNOSIS — Z23 Encounter for immunization: Secondary | ICD-10-CM

## 2019-12-26 NOTE — Progress Notes (Signed)
   Covid-19 Vaccination Clinic  Name:  JENSEN CHERAMIE    MRN: [id number] DOB: 17-Oct-1970  12/26/2019  Ms. Puello was observed post Covid-19 immunization for 15 minutes without incident. She was provided with Vaccine Information Sheet and instruction to access the V-Safe system.   Ms. Deremer was instructed to call 911 with any severe reactions post vaccine: Marland Kitchen Difficulty breathing  . Swelling of face and throat  . A fast heartbeat  . A bad rash all over body  . Dizziness and weakness   Immunizations Administered    Name Date Dose VIS Date Route   Pfizer COVID-19 Vaccine 12/26/2019  9:06 AM 0.3 mL 09/18/2019 Intramuscular   Manufacturer: [employer], [employer]   Lot: [id number]   NDC: [address]-2
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
